# Patient Record
Sex: Male | Born: 1980 | Race: Black or African American | Hispanic: No | Marital: Single | Smoking: Never smoker
Health system: Southern US, Community
[De-identification: ages and names within clinical notes are randomized; demographics above are authoritative.]

---

## 2005-10-07 ENCOUNTER — Emergency Department (HOSPITAL_COMMUNITY): Admission: EM | Admit: 2005-10-07 | Discharge: 2005-10-07 | Payer: Self-pay | Admitting: Family Medicine

## 2016-11-13 ENCOUNTER — Emergency Department (HOSPITAL_COMMUNITY)
Admission: EM | Admit: 2016-11-13 | Discharge: 2016-11-13 | Disposition: A | Discharge status: Home / Self Care | Payer: BLUE CROSS/BLUE SHIELD | Attending: Emergency Medicine | Admitting: Emergency Medicine

## 2016-11-13 ENCOUNTER — Encounter (HOSPITAL_COMMUNITY): Payer: Self-pay | Admitting: Emergency Medicine

## 2016-11-13 ENCOUNTER — Emergency Department (HOSPITAL_COMMUNITY): Payer: BLUE CROSS/BLUE SHIELD

## 2016-11-13 DIAGNOSIS — R1084 Generalized abdominal pain: Secondary | ICD-10-CM | POA: Diagnosis not present

## 2016-11-13 DIAGNOSIS — R1013 Epigastric pain: Secondary | ICD-10-CM | POA: Diagnosis present

## 2016-11-13 LAB — COMPREHENSIVE METABOLIC PANEL
ALBUMIN: 4.3 g/dL (ref 3.5–5.0)
ALK PHOS: 78 U/L (ref 38–126)
ALT: 40 U/L (ref 17–63)
ANION GAP: 10 (ref 5–15)
AST: 31 U/L (ref 15–41)
BUN: 11 mg/dL (ref 6–20)
CALCIUM: 8.8 mg/dL — AB (ref 8.9–10.3)
CO2: 23 mmol/L (ref 22–32)
Chloride: 106 mmol/L (ref 101–111)
Creatinine, Ser: 1.1 mg/dL (ref 0.61–1.24)
GFR calc non Af Amer: >60 mL/min (ref >60)
Glucose, Bld: 169 mg/dL — ABNORMAL HIGH (ref 65–99)
POTASSIUM: 3.1 mmol/L — AB (ref 3.5–5.1)
SODIUM: 139 mmol/L (ref 135–145)
Total Bilirubin: 0.4 mg/dL (ref 0.3–1.2)
Total Protein: 7.4 g/dL (ref 6.5–8.1)

## 2016-11-13 LAB — I-STAT TROPONIN, ED
TROPONIN I, POC: 0 ng/mL (ref 0.00–0.08)
TROPONIN I, POC: 0 ng/mL (ref 0.00–0.08)

## 2016-11-13 LAB — CBC
HCT: 35.7 % — ABNORMAL LOW (ref 39.0–52.0)
Hemoglobin: 12.4 g/dL — ABNORMAL LOW (ref 13.0–17.0)
MCH: 29.4 pg (ref 26.0–34.0)
MCHC: 34.7 g/dL (ref 30.0–36.0)
MCV: 84.6 fL (ref 78.0–100.0)
PLATELETS: 359 10*3/uL (ref 150–400)
RBC: 4.22 MIL/uL (ref 4.22–5.81)
RDW: 12.9 % (ref 11.5–15.5)
WBC: 7.4 10*3/uL (ref 4.0–10.5)

## 2016-11-13 LAB — LIPASE, BLOOD: Lipase: 33 U/L (ref 11–51)

## 2016-11-13 MED ORDER — IOPAMIDOL (ISOVUE-300) INJECTION 61%
INTRAVENOUS | Status: AC
  Filled 2016-11-13: qty 100

## 2016-11-13 MED ORDER — HYDROMORPHONE HCL 1 MG/ML IJ SOLN
1.0000 mg | Freq: Once | INTRAMUSCULAR | Status: AC
  Administered 2016-11-13: 1 mg via INTRAVENOUS
  Filled 2016-11-13: qty 1

## 2016-11-13 MED ORDER — ONDANSETRON HCL 4 MG PO TABS: 4.0000 mg | ORAL_TABLET | Freq: Four times a day (QID) | ORAL | 0 refills | Status: AC

## 2016-11-13 MED ORDER — HYDROCODONE-ACETAMINOPHEN 5-325 MG PO TABS: 1.0000 | ORAL_TABLET | Freq: Four times a day (QID) | ORAL | 0 refills | Status: AC | PRN

## 2016-11-13 MED ORDER — POTASSIUM CHLORIDE CRYS ER 20 MEQ PO TBCR
40.0000 meq | EXTENDED_RELEASE_TABLET | Freq: Once | ORAL | Status: AC
Start: 2016-11-13 — End: 2016-11-13
  Administered 2016-11-13: 40 meq via ORAL
  Filled 2016-11-13: qty 2

## 2016-11-13 MED ORDER — HYDROCODONE-ACETAMINOPHEN 5-325 MG PO TABS
2.0000 | ORAL_TABLET | Freq: Once | ORAL | Status: AC
  Administered 2016-11-13: 2 via ORAL
  Filled 2016-11-13: qty 2

## 2016-11-13 MED ORDER — SODIUM CHLORIDE 0.9 % IV BOLUS (SEPSIS)
1000.0000 mL | Freq: Once | INTRAVENOUS | Status: AC
Start: 2016-11-13 — End: 2016-11-13
  Administered 2016-11-13: 1000 mL via INTRAVENOUS

## 2016-11-13 MED ORDER — MORPHINE SULFATE (PF) 2 MG/ML IV SOLN
4.0000 mg | Freq: Once | INTRAVENOUS | Status: AC
  Administered 2016-11-13: 4 mg via INTRAVENOUS
  Filled 2016-11-13: qty 2

## 2016-11-13 MED ORDER — IOPAMIDOL (ISOVUE-300) INJECTION 61%
100.0000 mL | Freq: Once | INTRAVENOUS | Status: AC | PRN
  Administered 2016-11-13: 100 mL via INTRAVENOUS

## 2016-11-13 MED ORDER — GI COCKTAIL ~~LOC~~
30.0000 mL | Freq: Once | ORAL | Status: AC
  Administered 2016-11-13: 30 mL via ORAL
  Filled 2016-11-13: qty 30

## 2016-11-13 MED ORDER — ONDANSETRON HCL 4 MG/2ML IJ SOLN
4.0000 mg | Freq: Once | INTRAMUSCULAR | Status: AC
  Administered 2016-11-13: 4 mg via INTRAVENOUS
  Filled 2016-11-13: qty 2

## 2016-11-13 NOTE — ED Triage Notes (Signed)
Pt reports having sharp epigastric pain that began at 2000 on 11/12/16. Pt reporting also shortness of breath.

## 2016-11-13 NOTE — ED Provider Notes (Signed)
[facility]-EMERGENCY DEPT Provider Note   CSN: [id number] Arrival date & time: 11/13/16  0106     History   Chief Complaint Chief Complaint  Patient presents with  . Abdominal Pain  . Chest Pain    HPI James Mahoney is a 36 y.o. male.  Past medical history presents to the emergency department with chief complaint of epigastric pain. He states that his symptoms started about 2 hours ago. He describes symptoms that sharp and stabbing.  He reports some associated SOB.  He denies any radiating pain. He has not taken anything for his symptoms. He reports drinking coffee prior to the onset. Denies ever having any symptoms like this before. Denies any history of PE, DVT, or ACS. He does not take any medications. He denies any nausea or vomiting. He states that it feels like the pain is moving in his upper abdomen.  He denies drug or alcohol use.  He does not smoke.   The history is provided by the patient. No language interpreter was used.    History reviewed. No pertinent past medical history.  There are no active problems to display for this patient.   History reviewed. No pertinent surgical history.     Home Medications    Prior to Admission medications   Not on File    Family History History reviewed. No pertinent family history.  Social History Social History  Substance Use Topics  . Smoking status: Never Smoker  . Smokeless tobacco: Never Used  . Alcohol use No     Allergies   Patient has no known allergies.   Review of Systems Review of Systems  All other systems reviewed and are negative.    Physical Exam Updated Vital Signs BP (!) 132/96 (BP Location: Right Arm)   Pulse 63   Temp 98 F (36.7 C) (Oral)   Resp 18   Ht 5\' 11"  (1.803 m)   Wt 102.1 kg (225 lb)   SpO2 100%   BMI 31.38 kg/m   Physical Exam  Constitutional: He is oriented to person, place, and time. He appears well-developed and well-nourished.  HENT:  Head: Normocephalic and  atraumatic.  Eyes: Conjunctivae and EOM are normal. Pupils are equal, round, and reactive to light. Right eye exhibits no discharge. Left eye exhibits no discharge. No scleral icterus.  Neck: Normal range of motion. Neck supple. No JVD present.  Cardiovascular: Normal rate, regular rhythm and normal heart sounds.  Exam reveals no gallop and no friction rub.   No murmur heard. Pulmonary/Chest: Effort normal and breath sounds normal. No respiratory distress. He has no wheezes. He has no rales. He exhibits no tenderness.  Abdominal: Soft. He exhibits no distension and no mass. There is no tenderness. There is no rebound and no guarding.  No focal abdominal tenderness, no RLQ tenderness or pain at McBurney's point, no RUQ tenderness or Murphy's sign, no left-sided abdominal tenderness, no fluid wave, or signs of peritonitis   Musculoskeletal: Normal range of motion. He exhibits no edema or tenderness.  Neurological: He is alert and oriented to person, place, and time.  Skin: Skin is warm and dry.  Psychiatric: He has a normal mood and affect. His behavior is normal. Judgment and thought content normal.  Nursing note and vitals reviewed.    ED Treatments / Results  Labs (all labs ordered are listed, but only abnormal results are displayed) Labs Reviewed  CBC  COMPREHENSIVE METABOLIC PANEL  LIPASE, BLOOD  I-STAT TROPOININ, ED  EKG  EKG Interpretation  Date/Time:  Monday November 13 2016 01:14:33 EDT Ventricular Rate:  60 PR Interval:    QRS Duration: 92 QT Interval:  454 QTC Calculation: 454 R Axis:   79 Text Interpretation:  Sinus rhythm Prolonged PR interval Probable left atrial enlargement Abnormal T, consider ischemia, diffuse leads No old tracing to compare Confirmed by Devoria Albe ([id number]) on 11/13/2016 1:25:49 AM       Radiology No results found.  Procedures Procedures (including critical care time)  Medications Ordered in ED Medications  gi cocktail  (Maalox,Lidocaine,Donnatal) (not administered)     Initial Impression / Assessment and Plan / ED Course  I have reviewed the triage vital signs and the nursing notes.  Pertinent labs & imaging results that were available during my care of the patient were reviewed by me and considered in my medical decision making (see chart for details).    Patient with epigastric pain x 2 hours.  No cardiac risk factors, but does have abnormal ekg.  EKG reviewed by Dr. Lynelle Doctor, who recommends 2 troponins.  Will also check abdominal labs and give GI cocktail.  2:17 AM Improved after vomiting, now states that he feels more discomfort in his lower abdomen, and none in his upper abdomen or chest.  2:49 AM Pain is returning. He states that it is more around his bellybutton and in his right lower quadrant now. He appears uncomfortable. Will give additional pain medicine and check CT.  4:29 AM CT is reassuring, no acute abdominal process identified. Delta troponin is negative.  VSS.  Recommend PCP follow-up.    Patient instructed to return for:  New or worsening symptoms, including, increased abdominal pain, especially pain that localizes to one side, bloody vomit, bloody diarrhea, fever >101, and intractable vomiting.   Final Clinical Impressions(s) / ED Diagnoses   Final diagnoses:  Generalized abdominal pain    New Prescriptions New Prescriptions   HYDROCODONE-ACETAMINOPHEN (NORCO/VICODIN) 5-325 MG TABLET    Take 1-2 tablets by mouth every 6 (six) hours as needed.   ONDANSETRON (ZOFRAN) 4 MG TABLET    Take 1 tablet (4 mg total) by mouth every 6 (six) hours.     Roxy Horseman, PA-C 11/13/16 Georga Kaufmann, MD 11/13/16 [phone]

## 2017-09-20 ENCOUNTER — Other Ambulatory Visit: Payer: Self-pay | Admitting: Gastroenterology

## 2017-09-20 DIAGNOSIS — R1084 Generalized abdominal pain: Secondary | ICD-10-CM

## 2017-09-26 ENCOUNTER — Other Ambulatory Visit: Payer: Self-pay | Admitting: Gastroenterology

## 2017-09-26 DIAGNOSIS — R1013 Epigastric pain: Secondary | ICD-10-CM

## 2017-10-12 ENCOUNTER — Ambulatory Visit
Admission: RE | Admit: 2017-10-12 | Discharge: 2017-10-12 | Disposition: A | Discharge status: Home / Self Care | Payer: BLUE CROSS/BLUE SHIELD | Source: Ambulatory Visit | Attending: Gastroenterology | Admitting: Gastroenterology

## 2017-10-12 ENCOUNTER — Other Ambulatory Visit: Payer: Self-pay | Admitting: Gastroenterology

## 2017-10-12 DIAGNOSIS — R1013 Epigastric pain: Secondary | ICD-10-CM

## 2018-10-14 IMAGING — DX DG UGI W/ SMALL BOWEL
2 series · 2 of 2 positions shown · non-contrast
Comparison: CT scan 11/13/2016

CLINICAL DATA: Generalized abdominal pain and epigastric pain.

EXAM:
UPPER GI SERIES WITH SMALL BOWEL FOLLOW-THROUGH
FLUOROSCOPY TIME:  Fluoroscopy Time:  2 minutes and 48 seconds
Radiation Exposure Index (if provided by the fluoroscopic device):
201 mGy
Number of Acquired Spot Images: 0
TECHNIQUE: Combined double contrast and single contrast upper GI series using
effervescent crystals, thick barium, and thin barium. Subsequently,
serial images of the small bowel were obtained including spot views
of the terminal ileum.

[dg abd 1 view (1 of 2)]
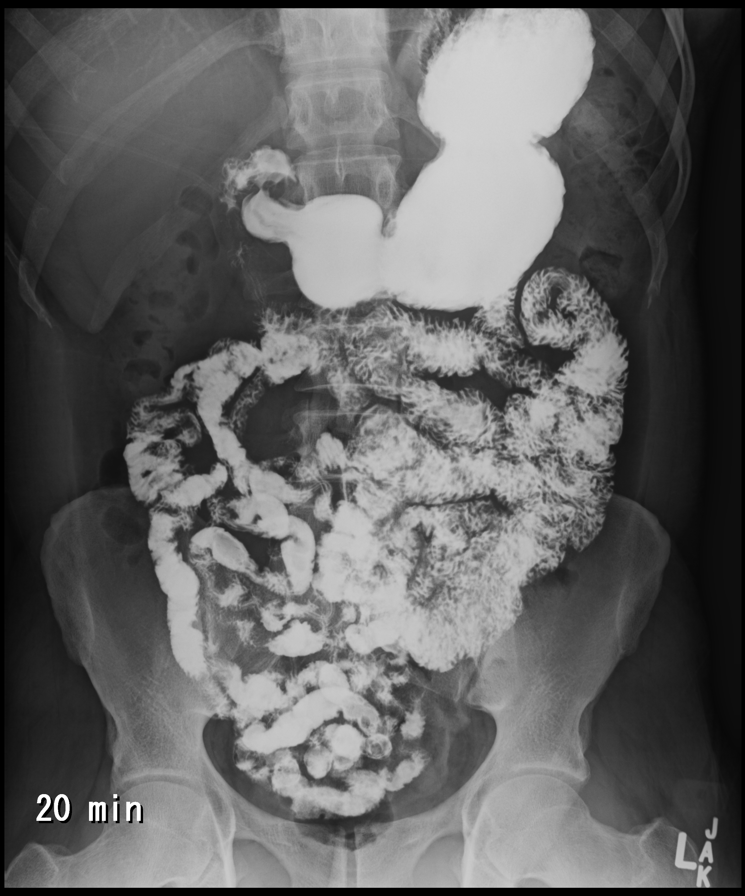

[dg abd 1 view (2 of 2)]
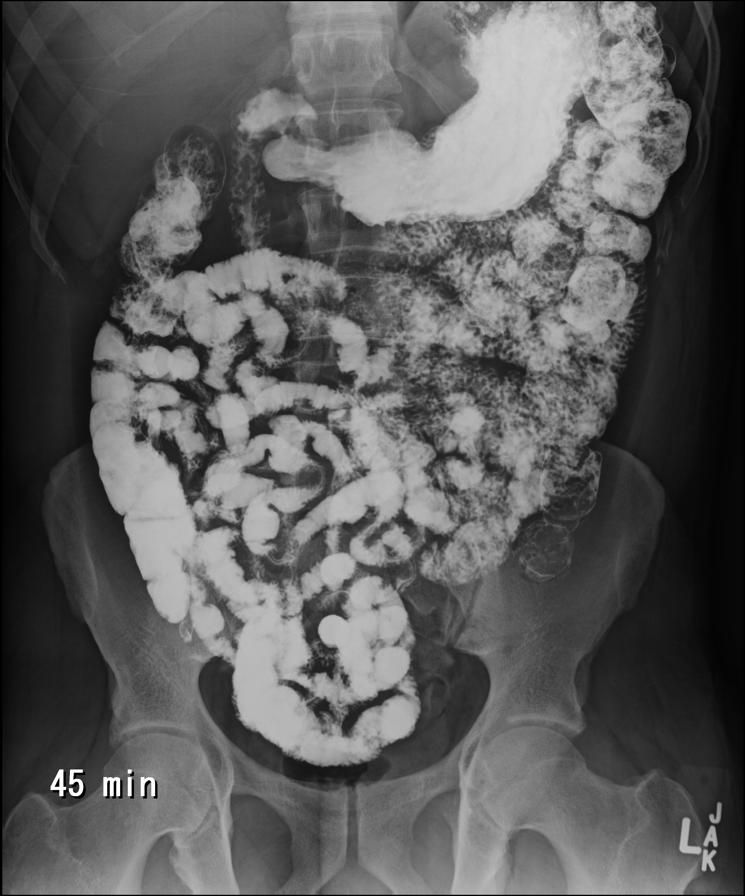

[2 of 2 positions shown; findings below may reference images not displayed]

FINDINGS: Initial barium swallows demonstrate normal esophageal motility. No
intrinsic or extrinsic lesions are identified. No mucosal
abnormalities. Small sliding-type hiatal hernia but no demonstrable
GE reflux.

Normal appearance of the stomach, duodenum bulb and C-loop. Normal
mucosal folds.

Normal small bowel transit time. Normal mucosal fold pattern in the
jejunum and ileum. No fixed filling defects, intrinsic or extrinsic
lesions. The terminal ileum is normal. The appendix is normal.
IMPRESSION: 1. Normal esophageal motility. No distal mass or stricture. Small
sliding-type hiatal hernia but no demonstrable GE reflux.
2. Normal appearance of the stomach and duodenum.
3. Normal small bowel transit time and normal small bowel
follow-through examination.
